# Patient Record
Sex: Female | Born: 2000 | Race: White | Hispanic: No | Marital: Single | Smoking: Never smoker
Health system: Southern US, Community
[De-identification: ages and names within clinical notes are randomized; demographics above are authoritative.]

---
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
## 2022-12-13 IMAGING — CT CT HEAD W/O CM
3 series · 15 of 44 positions shown, 18 images · non-contrast
Comparison: None.

CLINICAL DATA: Sinus congestion and headache

EXAM:
CT HEAD WITHOUT CONTRAST
TECHNIQUE: Contiguous axial images were obtained from the base of the skull
through the vertex without intravenous contrast.

[Series 2: head wo · axial · 0.41mm/px · z∈[+407,+517]mm · 9 of 27 slices shown, 12 images]
[im 3/27  brain]
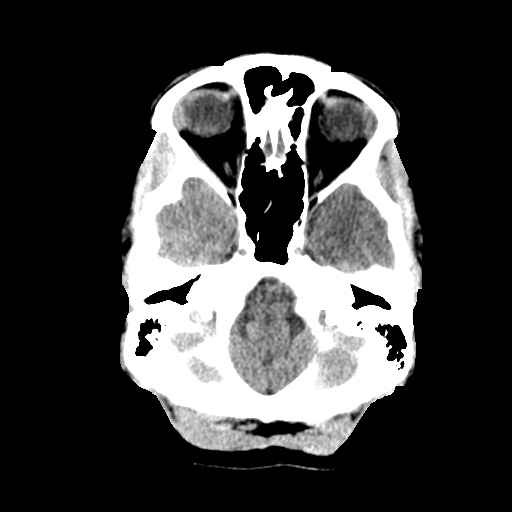
[im 3/27  bone]
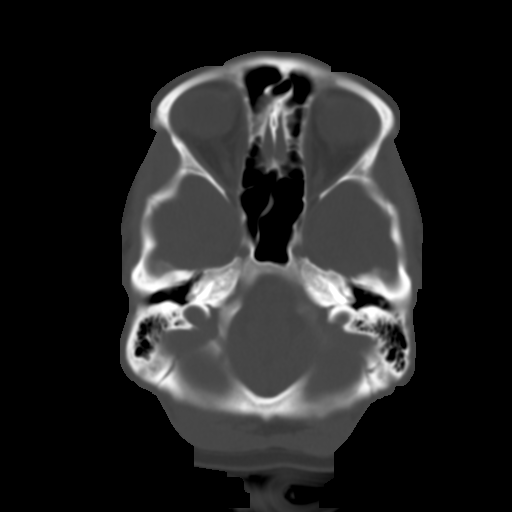
[im 6/27  brain]
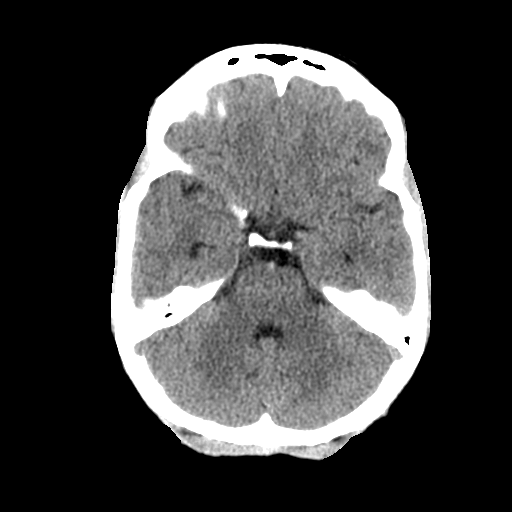
[im 8/27  brain]
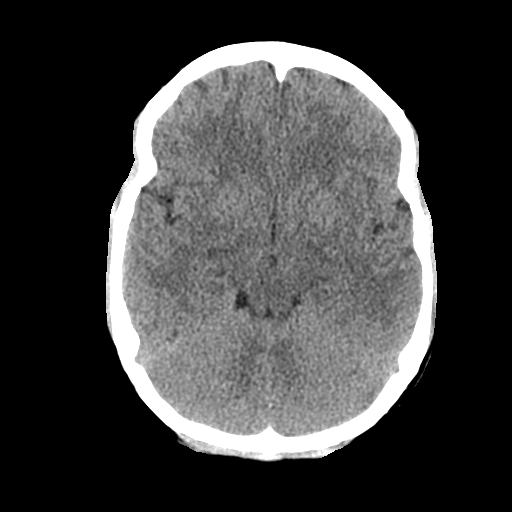
[im 11/27  brain]
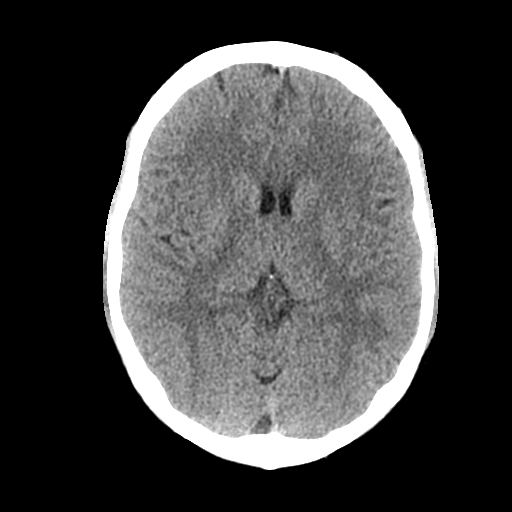
[im 14/27  brain]
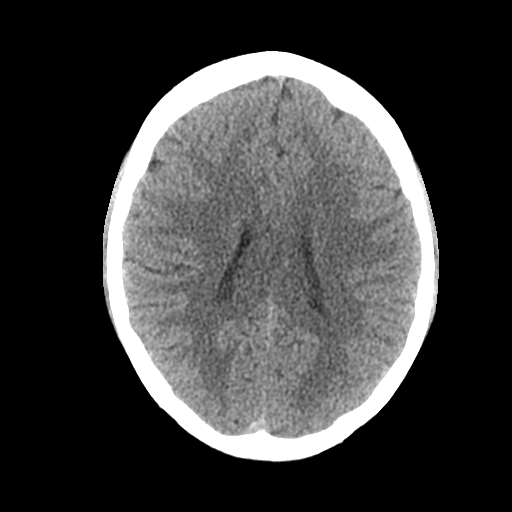
[im 14/27  bone]
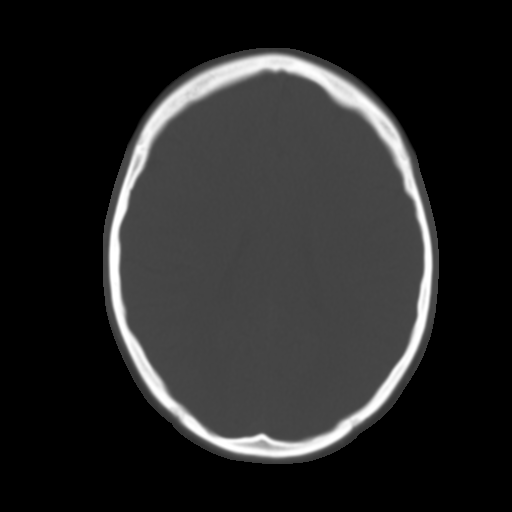
[im 17/27  brain]
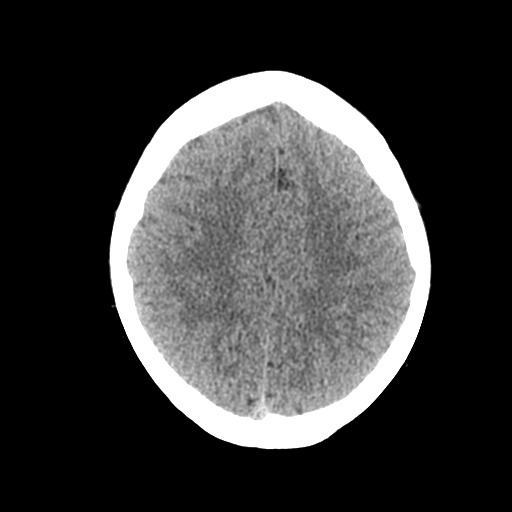
[im 20/27  brain]
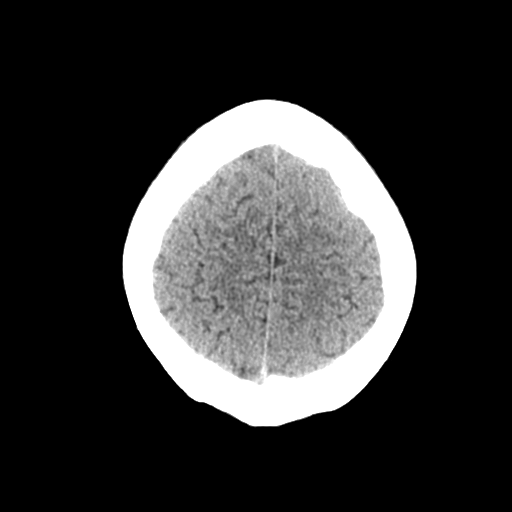
[im 22/27  brain]
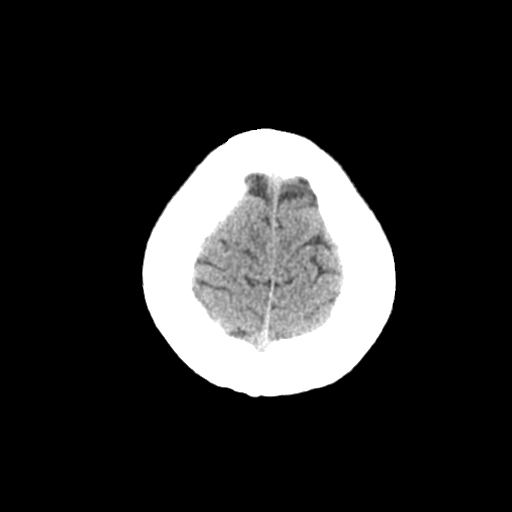
[im 25/27  brain]
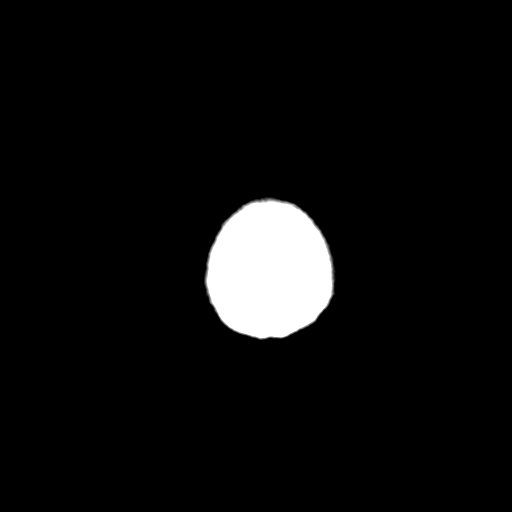
[im 25/27  bone]
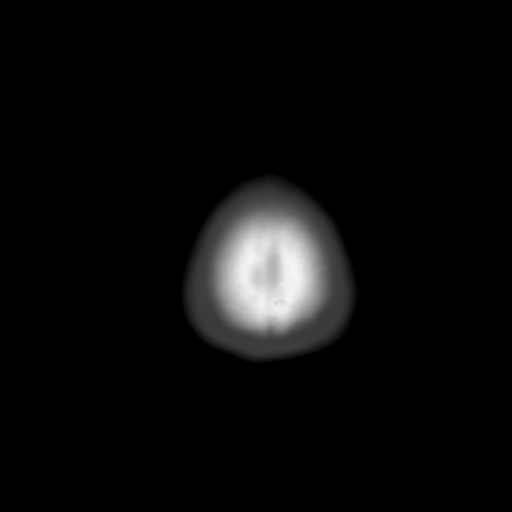

[Series 4: coronal soft tissue · coronal · 0.27mm/px · 3 of 61 slices shown]
[im 21/61  brain]
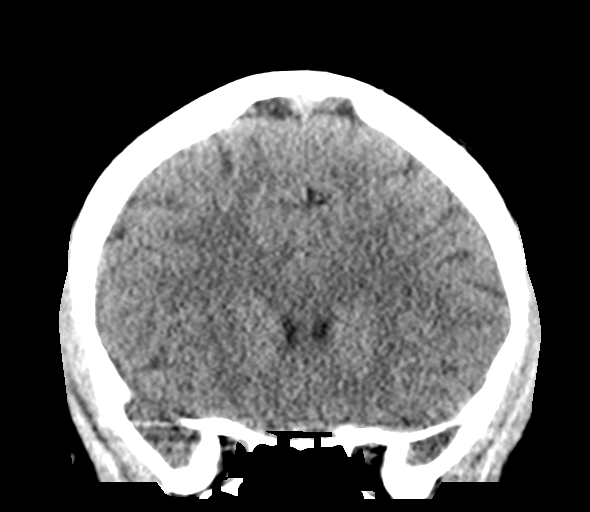
[im 27/61  brain]
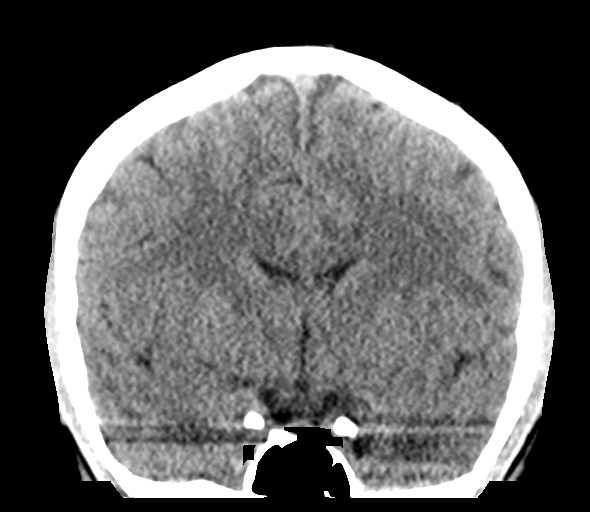
[im 34/61  brain]
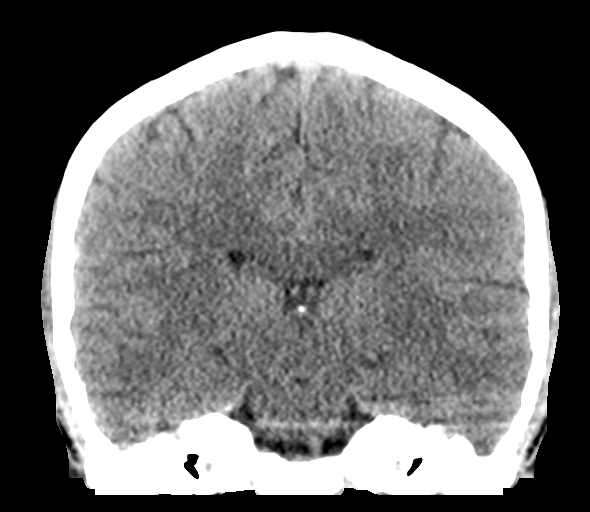

[Series 5: sagittal soft tissue · sagittal · 0.27mm/px · 3 of 51 slices shown]
[im 17/51  brain]
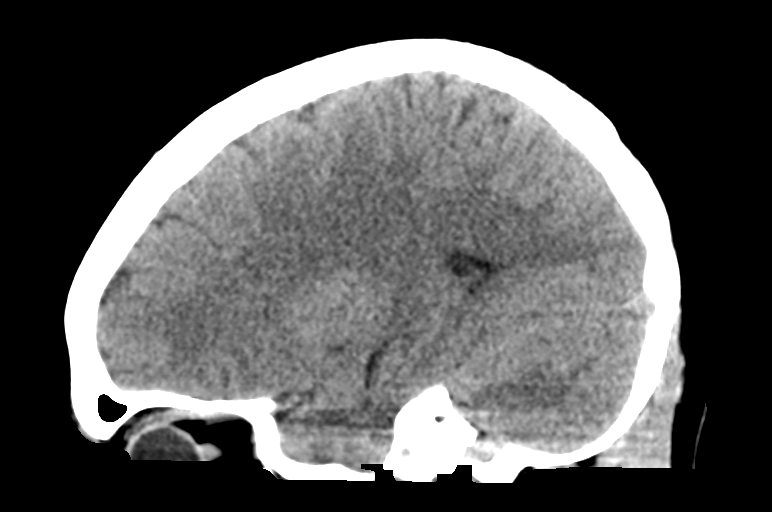
[im 26/51  brain]
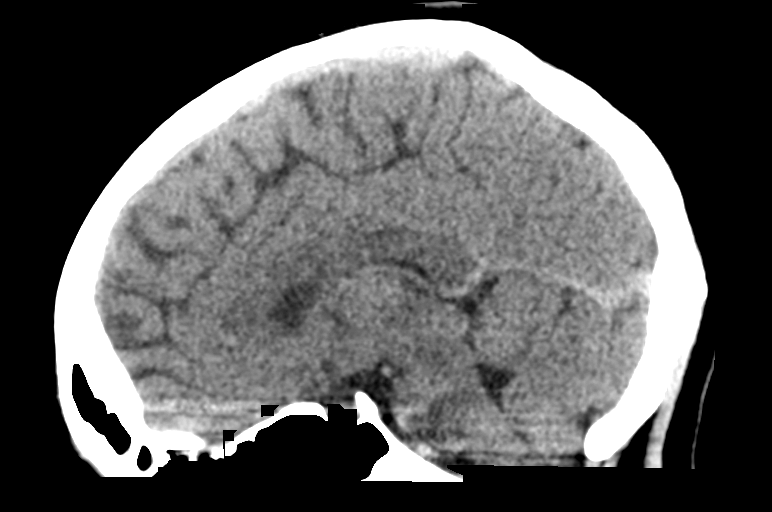
[im 34/51  brain]
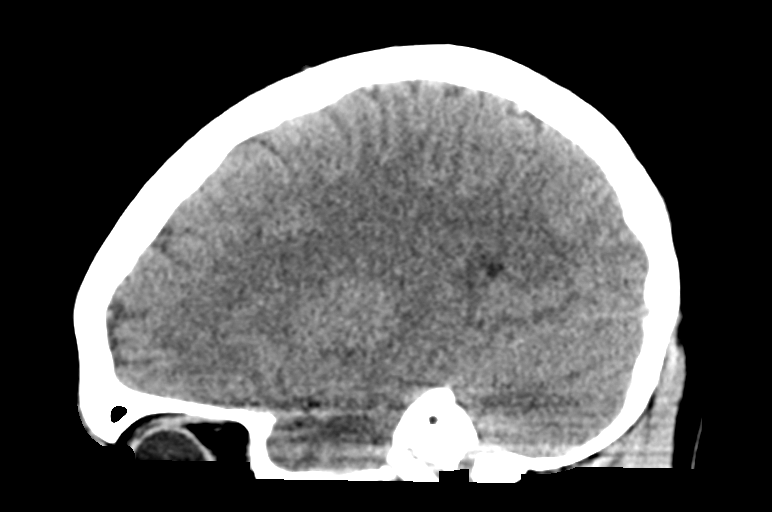

[15 of 44 positions shown; findings below may reference images not displayed]

FINDINGS: Brain: No evidence of acute infarction, hemorrhage, hydrocephalus,
extra-axial collection or mass lesion/mass effect.

Vascular: No hyperdense vessel or unexpected calcification.

Skull: Normal. Negative for fracture or focal lesion.

Sinuses/Orbits: Patchy mucosal thickening within the anterior
ethmoid air cells with mild mucosal thickening in the sphenoid and
frontal sinuses. No acute fluid level

Other: None
IMPRESSION: 1. No CT evidence for acute intracranial abnormality.
2. Mild sinus disease
